# Patient Record
Sex: Male | Born: 2005 | Race: Black or African American | Hispanic: No | Marital: Single | Smoking: Never smoker
Health system: Southern US, Community
[De-identification: ages and names within clinical notes are randomized; demographics above are authoritative.]

## PROBLEM LIST (undated history)

## (undated) ENCOUNTER — Ambulatory Visit (HOSPITAL_COMMUNITY): Admission: EM | Source: Home / Self Care

---
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
## 2024-01-07 ENCOUNTER — Ambulatory Visit

## 2024-02-17 ENCOUNTER — Ambulatory Visit (HOSPITAL_COMMUNITY)
Admission: EM | Admit: 2024-02-17 | Discharge: 2024-02-18 | Disposition: A | Discharge status: Other Acute Inpatient Hospital | Source: Intra-hospital | Attending: Psychiatry | Admitting: Psychiatry

## 2024-02-17 ENCOUNTER — Ambulatory Visit: Payer: Self-pay

## 2024-02-17 VITALS — BP 122/80 | HR 66 | Ht 71.0 in | Wt 206.2 lb

## 2024-02-17 DIAGNOSIS — F4310 Post-traumatic stress disorder, unspecified: Secondary | ICD-10-CM | POA: Diagnosis not present

## 2024-02-17 DIAGNOSIS — F649 Gender identity disorder, unspecified: Secondary | ICD-10-CM

## 2024-02-17 DIAGNOSIS — F640 Transsexualism: Secondary | ICD-10-CM | POA: Insufficient documentation

## 2024-02-17 DIAGNOSIS — R45851 Suicidal ideations: Secondary | ICD-10-CM | POA: Insufficient documentation

## 2024-02-17 DIAGNOSIS — F332 Major depressive disorder, recurrent severe without psychotic features: Secondary | ICD-10-CM | POA: Diagnosis not present

## 2024-02-17 DIAGNOSIS — G4700 Insomnia, unspecified: Secondary | ICD-10-CM | POA: Insufficient documentation

## 2024-02-17 DIAGNOSIS — F32A Depression, unspecified: Secondary | ICD-10-CM | POA: Insufficient documentation

## 2024-02-17 LAB — CBC WITH DIFFERENTIAL/PLATELET
Abs Immature Granulocytes: 0.02 K/uL (ref 0.00–0.07)
Basophils Absolute: 0 K/uL (ref 0.0–0.1)
Basophils Relative: 0 %
Eosinophils Absolute: 0.2 K/uL (ref 0.0–0.5)
Eosinophils Relative: 3 %
HCT: 45 % (ref 39.0–52.0)
Hemoglobin: 13.5 g/dL (ref 13.0–17.0)
Immature Granulocytes: 0 %
Lymphocytes Relative: 34 %
Lymphs Abs: 1.9 K/uL (ref 0.7–4.0)
MCH: 22.5 pg — ABNORMAL LOW (ref 26.0–34.0)
MCHC: 30 g/dL (ref 30.0–36.0)
MCV: 74.9 fL — ABNORMAL LOW (ref 80.0–100.0)
Monocytes Absolute: 0.6 K/uL (ref 0.1–1.0)
Monocytes Relative: 11 %
Neutro Abs: 2.8 K/uL (ref 1.7–7.7)
Neutrophils Relative %: 52 %
Platelets: 204 K/uL (ref 150–400)
RBC: 6.01 MIL/uL — ABNORMAL HIGH (ref 4.22–5.81)
RDW: 15.1 % (ref 11.5–15.5)
WBC: 5.5 K/uL (ref 4.0–10.5)
nRBC: 0 % (ref 0.0–0.2)

## 2024-02-17 LAB — COMPREHENSIVE METABOLIC PANEL WITH GFR
ALT: 70 U/L — ABNORMAL HIGH (ref 0–44)
AST: 41 U/L (ref 15–41)
Albumin: 4.6 g/dL (ref 3.5–5.0)
Alkaline Phosphatase: 98 U/L (ref 38–126)
Anion gap: 11 (ref 5–15)
BUN: 16 mg/dL (ref 6–20)
CO2: 29 mmol/L (ref 22–32)
Calcium: 10 mg/dL (ref 8.9–10.3)
Chloride: 101 mmol/L (ref 98–111)
Creatinine, Ser: 0.92 mg/dL (ref 0.61–1.24)
GFR, Estimated: >60 mL/min (ref >60)
Glucose, Bld: 81 mg/dL (ref 70–99)
Potassium: 5.1 mmol/L (ref 3.5–5.1)
Sodium: 141 mmol/L (ref 135–145)
Total Bilirubin: 0.3 mg/dL (ref 0.0–1.2)
Total Protein: 7.4 g/dL (ref 6.5–8.1)

## 2024-02-17 LAB — POCT URINE DRUG SCREEN - MANUAL ENTRY (I-SCREEN)
POC Amphetamine UR: NOT DETECTED
POC Buprenorphine (BUP): NOT DETECTED
POC Cocaine UR: NOT DETECTED
POC Marijuana UR: NOT DETECTED
POC Methadone UR: NOT DETECTED
POC Methamphetamine UR: NOT DETECTED
POC Morphine: NOT DETECTED
POC Oxazepam (BZO): NOT DETECTED
POC Oxycodone UR: NOT DETECTED
POC Secobarbital (BAR): NOT DETECTED

## 2024-02-17 LAB — LIPID PANEL
Cholesterol: 175 mg/dL (ref 0–200)
HDL: 52 mg/dL (ref >40)
LDL Cholesterol: 93 mg/dL (ref 0–99)
Total CHOL/HDL Ratio: 3.4 ratio
Triglycerides: 151 mg/dL — ABNORMAL HIGH (ref <150)
VLDL: 30 mg/dL (ref 0–40)

## 2024-02-17 LAB — TSH: TSH: 0.916 u[IU]/mL (ref 0.350–4.500)

## 2024-02-17 MED ORDER — LORAZEPAM 2 MG/ML IJ SOLN: 2.0000 mg | Freq: Three times a day (TID) | INTRAMUSCULAR | Status: DC | PRN

## 2024-02-17 MED ORDER — HALOPERIDOL 5 MG PO TABS: 5.0000 mg | ORAL_TABLET | Freq: Three times a day (TID) | ORAL | Status: DC | PRN

## 2024-02-17 MED ORDER — ACETAMINOPHEN 325 MG PO TABS: 650.0000 mg | ORAL_TABLET | Freq: Four times a day (QID) | ORAL | Status: DC | PRN

## 2024-02-17 MED ORDER — DIPHENHYDRAMINE HCL 50 MG PO CAPS: 50.0000 mg | ORAL_CAPSULE | Freq: Three times a day (TID) | ORAL | Status: DC | PRN

## 2024-02-17 MED ORDER — DIPHENHYDRAMINE HCL 50 MG/ML IJ SOLN: 50.0000 mg | Freq: Three times a day (TID) | INTRAMUSCULAR | Status: DC | PRN

## 2024-02-17 MED ORDER — HALOPERIDOL LACTATE 5 MG/ML IJ SOLN: 10.0000 mg | Freq: Three times a day (TID) | INTRAMUSCULAR | Status: DC | PRN

## 2024-02-17 MED ORDER — HALOPERIDOL LACTATE 5 MG/ML IJ SOLN: 5.0000 mg | Freq: Three times a day (TID) | INTRAMUSCULAR | Status: DC | PRN

## 2024-02-17 MED ORDER — HYDROXYZINE HCL 25 MG PO TABS
25.0000 mg | ORAL_TABLET | ORAL | Status: AC
  Administered 2024-02-17: 23:00:00 25 mg via ORAL
  Filled 2024-02-17: qty 1

## 2024-02-17 NOTE — Assessment & Plan Note (Addendum)
 Concern for active suicidality in patient.  She was previously hospitalized at [facility] in September for suicide attempt.  Discussed present concern for patient's safety to oneself.  Patient amenable to transferring to [facility] for further assessment and possible treatment.  Arranged safe transportation for patient there.  Escorted patient to car side.  Informed [facility] nursing regarding incoming patient.

## 2024-02-17 NOTE — Progress Notes (Cosign Needed Addendum)
° ° °  SUBJECTIVE:   CHIEF COMPLAINT / HPI:   Mood Patient presenting today to discuss overall worsening mood recently.  She describes that over the past few weeks her mood is worse and where she is having more suicidal thoughts.  She has thought to herself that she may use medicines to overdose, though she has no active plan to do so.  Her mother's boyfriend has not been supportive of her transition.  It has been challenging when those around her misgender her or used incorrect pronouns.  She is still following with her therapist.  PERTINENT  PMH / PSH: Gender dysphoria, MDD, PTSD  OBJECTIVE:   BP 122/80   Pulse 66   Ht 5' 11 (1.803 m)   Wt 206 lb 3.2 oz (93.5 kg)   SpO2 96%   BMI 28.76 kg/m    General: No acute distress.  Pulm: Breathing comfortably on room air.  Psych: Normal speech. Intermittent tense posture and fidgeting.   Flowsheet Row Office Visit from 02/17/2024 in [facility] [place]. [facility]  PHQ-9 Total Score 13    ASSESSMENT/PLAN:   Assessment & Plan MDD (major depressive disorder), recurrent severe, without psychosis (HCC) Concern for active suicidality in patient.  She was previously hospitalized at [facility] in September for suicide attempt.  Discussed present concern for patient's safety to oneself.  Patient amenable to transferring to [facility] for further assessment and possible treatment.  Arranged safe transportation for patient there.  Escorted patient to car side.  Informed [facility] nursing regarding incoming patient. Gender dysphoria Provided patient with contact information for endocrinology referral that was previously placed and improved.  Patient interested in freezing sperm prior to initiating gender care.  Discussed details of gender care avenues.    Damien Cassis, MD [facility]

## 2024-02-17 NOTE — Discharge Instructions (Addendum)
 To [facility]
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
## 2024-02-17 NOTE — Assessment & Plan Note (Addendum)
 Provided patient with contact information for endocrinology referral that was previously placed and improved.  Patient interested in freezing sperm prior to initiating gender care.  Discussed details of gender care avenues.

## 2024-02-17 NOTE — Patient Instructions (Addendum)
 [facility] [address] P [phone]

## 2024-02-17 NOTE — Progress Notes (Signed)
 Pt was accepted to [facility] on  02/17/2024  Bed assignment:316   Pt meets inpatient criteria per Lynwood Lavone Bash, MD   Attending Physician will be: Dr.Jadepalle    Report can be called to: [phone]  Pt can arrive pending labs   Care Team Notified: [facility] Cherylynn Ernst, RN,  [place] JAYSON Albee, RN, Lynwood Lavone Bash, MD
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
## 2024-02-17 NOTE — Progress Notes (Signed)
°   02/17/24 1307  [facility] Triage Screening (Walk-ins at [facility] only)  How Did You Hear About Us ? Primary Care  What Is the Reason for Your Visit/Call Today? Wayne Henry is a 18Y transgender male presenting to [facility] vol via safe transport. Pt was brought here today after they had a visit with their doctor at [facility]. Pt states they have constant suicidal thoughts with no plan. Pt states there is no trigger and the thoughts never go away. Pt endorses current SI. Pt denies HI, AVH, anf substance use. Pt has a hx of depression, anxiety, and ptsd.  How Long Has This Been Causing You Problems? 1 wk - 1 month  Have You Recently Had Any Thoughts About Hurting Yourself? Yes  How long ago did you have thoughts about hurting yourself? Now  Are You Planning to Commit Suicide/Harm Yourself At This time? Yes  Have you Recently Had Thoughts About Hurting Someone Sherral? No  Are You Planning To Harm Someone At This Time? No  Are you currently experiencing any auditory, visual or other hallucinations? No  Have You Used Any Alcohol or Drugs in the Past 24 Hours? No  Do you have any current medical co-morbidities that require immediate attention? No  What Do You Feel Would Help You the Most Today? Treatment for Depression or other mood problem  Determination of Need Urgent (48 hours)  Options For Referral Inpatient Hospitalization;Outpatient Therapy  Determination of Need filed? Yes

## 2024-02-18 ENCOUNTER — Inpatient Hospital Stay
Admission: AD | Admit: 2024-02-18 | Discharge: 2024-02-22 | DRG: 882 | Disposition: A | Discharge status: Home / Self Care | Source: Intra-hospital | Attending: Psychiatry | Admitting: Psychiatry

## 2024-02-18 ENCOUNTER — Other Ambulatory Visit: Payer: Self-pay

## 2024-02-18 DIAGNOSIS — F4310 Post-traumatic stress disorder, unspecified: Principal | ICD-10-CM | POA: Diagnosis present

## 2024-02-18 DIAGNOSIS — F32A Depression, unspecified: Secondary | ICD-10-CM | POA: Diagnosis present

## 2024-02-18 DIAGNOSIS — Z9151 Personal history of suicidal behavior: Secondary | ICD-10-CM

## 2024-02-18 DIAGNOSIS — Z9152 Personal history of nonsuicidal self-harm: Secondary | ICD-10-CM | POA: Diagnosis not present

## 2024-02-18 DIAGNOSIS — F640 Transsexualism: Secondary | ICD-10-CM | POA: Diagnosis present

## 2024-02-18 DIAGNOSIS — F419 Anxiety disorder, unspecified: Secondary | ICD-10-CM | POA: Diagnosis present

## 2024-02-18 DIAGNOSIS — Z8249 Family history of ischemic heart disease and other diseases of the circulatory system: Secondary | ICD-10-CM | POA: Diagnosis not present

## 2024-02-18 DIAGNOSIS — Z79899 Other long term (current) drug therapy: Secondary | ICD-10-CM

## 2024-02-18 DIAGNOSIS — R45851 Suicidal ideations: Secondary | ICD-10-CM | POA: Diagnosis present

## 2024-02-18 DIAGNOSIS — Z833 Family history of diabetes mellitus: Secondary | ICD-10-CM

## 2024-02-18 LAB — HEMOGLOBIN A1C
Hgb A1c MFr Bld: 5.2 % (ref 4.8–5.6)
Mean Plasma Glucose: 102.54 mg/dL

## 2024-02-18 MED ORDER — LORAZEPAM 2 MG/ML IJ SOLN: 2.0000 mg | Freq: Three times a day (TID) | INTRAMUSCULAR | Status: DC | PRN

## 2024-02-18 MED ORDER — DIPHENHYDRAMINE HCL 50 MG/ML IJ SOLN: 50.0000 mg | Freq: Three times a day (TID) | INTRAMUSCULAR | Status: DC | PRN

## 2024-02-18 MED ORDER — HALOPERIDOL 5 MG PO TABS: 5.0000 mg | ORAL_TABLET | Freq: Three times a day (TID) | ORAL | Status: DC | PRN

## 2024-02-18 MED ORDER — HYDROXYZINE HCL 25 MG PO TABS
25.0000 mg | ORAL_TABLET | Freq: Three times a day (TID) | ORAL | Status: DC | PRN
  Administered 2024-02-18 – 2024-02-22 (×6): 25 mg via ORAL
  Filled 2024-02-18 (×6): qty 1

## 2024-02-18 MED ORDER — HALOPERIDOL LACTATE 5 MG/ML IJ SOLN: 10.0000 mg | Freq: Three times a day (TID) | INTRAMUSCULAR | Status: DC | PRN

## 2024-02-18 MED ORDER — DIPHENHYDRAMINE HCL 25 MG PO CAPS: 50.0000 mg | ORAL_CAPSULE | Freq: Three times a day (TID) | ORAL | Status: DC | PRN

## 2024-02-18 MED ORDER — MAGNESIUM HYDROXIDE 400 MG/5ML PO SUSP: 30.0000 mL | Freq: Every day | ORAL | Status: DC | PRN

## 2024-02-18 MED ORDER — SERTRALINE HCL 25 MG PO TABS
50.0000 mg | ORAL_TABLET | Freq: Every day | ORAL | Status: DC
  Administered 2024-02-18: 08:00:00 50 mg via ORAL
  Filled 2024-02-18: qty 2

## 2024-02-18 MED ORDER — ACETAMINOPHEN 325 MG PO TABS
650.0000 mg | ORAL_TABLET | Freq: Four times a day (QID) | ORAL | Status: DC | PRN
  Administered 2024-02-19: 650 mg via ORAL
  Filled 2024-02-18: qty 2

## 2024-02-18 MED ORDER — VITAMIN D (ERGOCALCIFEROL) 1.25 MG (50000 UNIT) PO CAPS
50000.0000 [IU] | ORAL_CAPSULE | ORAL | Status: DC
  Administered 2024-02-18: 08:00:00 50000 [IU] via ORAL
  Filled 2024-02-18: qty 1

## 2024-02-18 MED ORDER — SERTRALINE HCL 100 MG PO TABS
100.0000 mg | ORAL_TABLET | Freq: Every day | ORAL | Status: DC
  Administered 2024-02-19 – 2024-02-22 (×4): 100 mg via ORAL
  Filled 2024-02-18 (×4): qty 1

## 2024-02-18 MED ORDER — ALUM & MAG HYDROXIDE-SIMETH 200-200-20 MG/5ML PO SUSP: 30.0000 mL | ORAL | Status: DC | PRN

## 2024-02-18 MED ORDER — HALOPERIDOL LACTATE 5 MG/ML IJ SOLN: 5.0000 mg | Freq: Three times a day (TID) | INTRAMUSCULAR | Status: DC | PRN

## 2024-02-18 NOTE — Group Note (Signed)
 Date:  02/18/2024 Time:  10:09 AM  Group Topic/Focus:  Emotional Education:   The focus of this group is to discuss what feelings/emotions are, and how they are experienced.    Participation Level:  Did Not Attend   Wayne Henry 02/18/2024, 10:09 AM
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
## 2024-02-18 NOTE — Group Note (Signed)
 [facility] LCSW Group Therapy Note   Group Date: 02/18/2024 Start Time: 1300 End Time: 1400   Type of Therapy/Topic:  Group Therapy:  Balance in Life  Participation Level:  Did Not Attend   Description of Group:    This group will address the concept of balance and how it feels and looks when one is unbalanced. Patients will be encouraged to process areas in their lives that are out of balance, and identify reasons for remaining unbalanced. Facilitators will guide patients utilizing problem- solving interventions to address and correct the stressor making their life unbalanced. Understanding and applying boundaries will be explored and addressed for obtaining  and maintaining a balanced life. Patients will be encouraged to explore ways to assertively make their unbalanced needs known to significant others in their lives, using other group members and facilitator for support and feedback.  Therapeutic Goals: Patient will identify two or more emotions or situations they have that consume much of in their lives. Patient will identify signs/triggers that life has become out of balance:  Patient will identify two ways to set boundaries in order to achieve balance in their lives:  Patient will demonstrate ability to communicate their needs through discussion and/or role plays  Summary of Patient Progress: Patient did not attend group.   Therapeutic Modalities:   Cognitive Behavioral Therapy Solution-Focused Therapy Assertiveness Training   Nadara JONELLE Fam, LCSW

## 2024-02-18 NOTE — Plan of Care (Signed)

## 2024-02-18 NOTE — Group Note (Signed)
 Date:  02/18/2024 Time:  5:09 PM  Group Topic/Focus:  Activity Group: The focus of the group is to promote activity for the patients and encourage them to go outside to the courtyard and get some fresh air and exercise.    Participation Level:  Did Not Attend   Wayne Henry 02/18/2024, 5:09 PM

## 2024-02-18 NOTE — Group Note (Signed)
 Recreation Therapy Group Note   Group Topic:Goal Setting  Group Date: 02/18/2024 Start Time: 1000 End Time: 1050 Facilitators: Celestia Jeoffrey BRAVO, LRT, CTRS Location: Craft Room  Group Description: Product/process Development Scientist. Patients were given many different magazines, a glue stick, markers, and a piece of cardstock paper. LRT and pts discussed the importance of having goals in life. LRT and pts discussed the difference between short-term and long-term goals, as well as what a SMART goal is. LRT encouraged pts to create a vision board, with images they picked and then cut out with safety scissors from the magazine, for themselves, that capture their short and long-term goals. LRT encouraged pts to show and explain their vision board to the group.   Goal Area(s) Addressed:  Patient will gain knowledge of short vs. long term goals.  Patient will identify goals for themselves. Patient will practice setting SMART goals. Patient will verbalize their goals to LRT and peers.   Affect/Mood: N/A   Participation Level: Did not attend    Clinical Observations/Individualized Feedback: Patient did not attend.  Plan: Continue to engage patient in RT group sessions 2-3x/week.   Jeoffrey BRAVO Celestia, LRT, CTRS 02/18/2024 11:28 AM

## 2024-02-18 NOTE — Group Note (Signed)
 Date:  02/18/2024 Time:  9:21 PM  Group Topic/Focus:  Managing Feelings:   The focus of this group is to identify what feelings patients have difficulty handling and develop a plan to handle them in a healthier way upon discharge.    Pt did not attend group.  Eldean Nanna L 02/18/2024, 9:21 PM

## 2024-02-18 NOTE — Plan of Care (Signed)
 Pt new to the unit tonight, hasn't had time to progress  Problem: Education: Goal: Knowledge of [place] [facility] Education information/materials will improve Outcome: Not Progressing Goal: Emotional status will improve Outcome: Not Progressing Goal: Mental status will improve Outcome: Not Progressing Goal: Verbalization of understanding the information provided will improve Outcome: Not Progressing   Problem: Activity: Goal: Interest or engagement in activities will improve Outcome: Not Progressing Goal: Sleeping patterns will improve Outcome: Not Progressing   Problem: Coping: Goal: Ability to verbalize frustrations and anger appropriately will improve Outcome: Not Progressing Goal: Ability to demonstrate self-control will improve Outcome: Not Progressing   Problem: Health Behavior/Discharge Planning: Goal: Identification of resources available to assist in meeting health care needs will improve Outcome: Not Progressing Goal: Compliance with treatment plan for underlying cause of condition will improve Outcome: Not Progressing   Problem: Physical Regulation: Goal: Ability to maintain clinical measurements within normal limits will improve Outcome: Not Progressing   Problem: Safety: Goal: Periods of time without injury will increase Outcome: Not Progressing

## 2024-02-18 NOTE — Progress Notes (Signed)
 Pt calm and cooperative during shift. Pt participated in group and ate meals, new dinner tray provided with pt request. Pt denied SI/HI/AVH. Pt currently in highschool and inquired about moving to a different unit. AP reports that the pt is 18 and on the correct unit. Pt asked if she wanted her parents to bring homework from the school to complete, pt denied this request.      02/18/24 0753  Psych Admission Type (Psych Patients Only)  Admission Status Voluntary  Psychosocial Assessment  Patient Complaints None  Eye Contact Brief  Facial Expression Flat  Affect Sad  Speech Logical/coherent  Interaction Cautious  Motor Activity Slow  Appearance/Hygiene Unremarkable  Behavior Characteristics Cooperative;Calm;Appropriate to situation  Mood Depressed  Thought Process  Coherency WDL  Content WDL  Delusions WDL  Perception WDL  Hallucination None reported or observed  Judgment Impaired  Confusion WDL  Danger to Self  Current suicidal ideation? Denies  Agreement Not to Harm Self Yes  Description of Agreement verbal  Danger to Others  Danger to Others None reported or observed
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
## 2024-02-18 NOTE — Group Note (Signed)
 Recreation Therapy Group Note   Group Topic:General Recreation  Group Date: 02/18/2024 Start Time: 1525 End Time: 1545 Facilitators: Celestia Jeoffrey BRAVO, LRT, CTRS Location: Courtyard  Group Description: [employer]. LRT and patients played games of basketball, drew with chalk, and played corn hole while outside in the courtyard while getting fresh air and sunlight. Music was being played in the background. LRT and peers conversed about different games they have played before, what they do in their free time and anything else that is on their minds. LRT encouraged pts to drink water after being outside, sweating and getting their heart rate up.  Goal Area(s) Addressed: Patient will build on frustration tolerance skills. Patients will partake in a competitive play game with peers. Patients will gain knowledge of new leisure interest/hobby.    Affect/Mood: N/A   Participation Level: Did not attend    Clinical Observations/Individualized Feedback: Patient did not attend.  Plan: Continue to engage patient in RT group sessions 2-3x/week.   Jeoffrey BRAVO Celestia, LRT, CTRS 02/18/2024 4:16 PM

## 2024-02-19 DIAGNOSIS — F4310 Post-traumatic stress disorder, unspecified: Secondary | ICD-10-CM

## 2024-02-19 MED ORDER — NICOTINE POLACRILEX 2 MG MT GUM
2.0000 mg | CHEWING_GUM | OROMUCOSAL | Status: DC | PRN
  Administered 2024-02-19 – 2024-02-21 (×2): 2 mg via ORAL
  Filled 2024-02-19 (×2): qty 1

## 2024-02-19 MED ORDER — BUSPIRONE HCL 5 MG PO TABS
5.0000 mg | ORAL_TABLET | Freq: Two times a day (BID) | ORAL | Status: DC
  Administered 2024-02-20 – 2024-02-22 (×5): 5 mg via ORAL
  Filled 2024-02-19 (×5): qty 1

## 2024-02-19 MED ORDER — TRAZODONE HCL 50 MG PO TABS
50.0000 mg | ORAL_TABLET | Freq: Every evening | ORAL | Status: DC | PRN
  Administered 2024-02-20 – 2024-02-21 (×2): 50 mg via ORAL
  Filled 2024-02-19 (×2): qty 1

## 2024-02-19 MED ORDER — NICOTINE 14 MG/24HR TD PT24
14.0000 mg | MEDICATED_PATCH | Freq: Every day | TRANSDERMAL | Status: DC
  Administered 2024-02-19 – 2024-02-22 (×3): 14 mg via TRANSDERMAL
  Filled 2024-02-19 (×4): qty 1

## 2024-02-19 NOTE — Progress Notes (Signed)
 Pt calm and pleasant during assessment denying SI/HI/AVH. Pt observed by this control and instrumentation engineer appropriately with staff and peers on the unit by this clinical research associate. Pt compliant with medication administration per MD orders. Pt given education, support, and encouragement to be active in his treatment plan. Pt being monitored Q 15 minutes for safety per unit protocol, remains safe on the unit
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
## 2024-02-19 NOTE — Group Note (Signed)
 Recreation Therapy Group Note   Group Topic:Leisure Education  Group Date: 02/19/2024 Start Time: 1530 End Time: 1620 Facilitators: Celestia Jeoffrey FORBES ARTICE, CTRS Location: Craft Room  Group Description: Leisure. Patients were given the option to choose from journaling, coloring, drawing, making origami, playing with playdoh, listening to music or singing karaoke. LRT and pts discussed the meaning of leisure, the importance of participating in leisure during their free time/when they're outside of the hospital, as well as how our leisure interests can also serve as coping skills.   Goal Area(s) Addressed:  Patient will identify a current leisure interest.  Patient will learn the definition of leisure. Patient will practice making a positive decision. Patient will have the opportunity to try a new leisure activity. Patient will communicate with peers and LRT.   Affect/Mood: Appropriate   Participation Level: Active and Engaged   Participation Quality: Independent   Behavior: Calm and Cooperative   Speech/Thought Process: Coherent   Insight: Good   Judgement: Good   Modes of Intervention: Clarification, Education, Exploration, and Music   Patient Response to Interventions:  Attentive, Engaged, Interested , and Receptive   Education Outcome:  Acknowledges education   Clinical Observations/Individualized Feedback: Wayne Henry was active in their participation of session activities and group discussion. Pt identified draw and read as things she does in her free time.    Plan: Continue to engage patient in RT group sessions 2-3x/week.   Jeoffrey FORBES Celestia, LRT, CTRS 02/19/2024 4:59 PM

## 2024-02-19 NOTE — BHH Counselor (Signed)
 CSW attempted contact with [facility] Coordinator, Nat Rich [phone]). Contact was unable to be established but HIPAA compliant voicemail left with contact information for follow through.   Nadara SAUNDERS. Chaim, MSW, LCSW, LCAS 02/19/2024 4:27 PM

## 2024-02-19 NOTE — Group Note (Signed)
 Date:  02/19/2024 Time:  10:35 AM  Group Topic/Focus:  Goals Group:   The focus of this group is to help patients establish daily goals to achieve during treatment and discuss how the patient can incorporate goal setting into their daily lives to aide in recovery.    Participation Level:  Active  Participation Quality:  Appropriate  Affect:  Appropriate  Cognitive:  Appropriate  Insight: Appropriate  Engagement in Group:  Engaged  Modes of Intervention:  Discussion, Education, and Support  Additional Comments:    Deitra Caron Mainland 02/19/2024, 10:35 AM

## 2024-02-19 NOTE — Group Note (Signed)
 Date:  02/19/2024 Time:  8:54 PM  Group Topic/Focus:  Coping With Mental Health Crisis:   The purpose of this group is to help patients identify strategies for coping with mental health crisis.  Group discusses possible causes of crisis and ways to manage them effectively. Developing a Wellness Toolbox:   The focus of this group is to help patients develop a wellness toolbox with skills and strategies to promote recovery upon discharge.    Participation Level:  Active  Participation Quality:  Appropriate  Affect:  Appropriate  Cognitive:  Appropriate  Insight: Appropriate  Engagement in Group:  Engaged  Modes of Intervention:  Discussion  Additional Comments:    Philisha Weinel L 02/19/2024, 8:54 PM

## 2024-02-19 NOTE — Group Note (Signed)
 Date:  02/19/2024 Time:  7:12 PM  Group Topic/Focus:  Wellness Toolbox:   The focus of this group is to discuss various aspects of wellness, balancing those aspects and exploring ways to increase the ability to experience wellness.  Patients will create a wellness toolbox for use upon discharge.    Participation Level:  Active  Participation Quality:  Appropriate  Affect:  Appropriate  Cognitive:  Appropriate  Insight: Appropriate  Engagement in Group:  Engaged  Modes of Intervention:  Activity and Socialization  Additional Comments:    Wayne Henry 02/19/2024, 7:12 PM
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
## 2024-02-19 NOTE — Plan of Care (Signed)
   Problem: Education: Goal: Emotional status will improve Outcome: Progressing Goal: Mental status will improve Outcome: Progressing

## 2024-02-19 NOTE — Progress Notes (Signed)
" °   02/18/24 2046  Charting Type  Charting Type Shift assessment  Safety Check Verification  Has the RN verified the 15 minute safety check completion? Yes  Neurological  Neuro (WDL) WDL  HEENT  HEENT (WDL) WDL  Respiratory  Respiratory (WDL) WDL  Cardiac  Cardiac (WDL) WDL  Vascular  Vascular (WDL) WDL  Integumentary  Integumentary (WDL) X  Braden Scale (Ages 8 and up)  Sensory Perceptions 4  Moisture 4  Activity 4  Mobility 4  Nutrition 4  Friction and Shear 3  Braden Scale Score 23  Musculoskeletal  Musculoskeletal (WDL) WDL  Assistive Device None  Gastrointestinal  Gastrointestinal (WDL) WDL  GU Assessment  Genitourinary (WDL) WDL  Neurological  Level of Consciousness Alert    "

## 2024-02-19 NOTE — Plan of Care (Signed)
  Problem: Education: Goal: Emotional status will improve Outcome: Progressing   Problem: Education: Goal: Mental status will improve Outcome: Progressing   Problem: Education: Goal: Verbalization of understanding the information provided will improve Outcome: Progressing   Problem: Activity: Goal: Interest or engagement in activities will improve Outcome: Progressing   Problem: Activity: Goal: Sleeping patterns will improve Outcome: Progressing   

## 2024-02-20 DIAGNOSIS — F4310 Post-traumatic stress disorder, unspecified: Secondary | ICD-10-CM | POA: Diagnosis not present

## 2024-02-20 NOTE — Progress Notes (Signed)
" °   02/19/24 2000  Psych Admission Type (Psych Patients Only)  Admission Status Voluntary  Psychosocial Assessment  Patient Complaints None  Eye Contact Brief  Facial Expression Flat  Affect Anxious  Speech Logical/coherent;Soft  Interaction Assertive;Cautious  Motor Activity Slow  Appearance/Hygiene Improved  Behavior Characteristics Cooperative;Appropriate to situation  Mood Pleasant  Thought Process  Coherency WDL  Content WDL  Delusions WDL  Perception WDL  Hallucination None reported or observed  Judgment Impaired  Confusion WDL  Danger to Self  Current suicidal ideation? Denies  Agreement Not to Harm Self Yes  Description of Agreement verbal  Danger to Others  Danger to Others None reported or observed   Patient alert and oriented x 4, affect is flat but brightens upon approach, no distress noted, denies S/HI/AVH  15 minutes safety checks maintained will continue to monitor.  "

## 2024-02-20 NOTE — Group Note (Deleted)
 Date:  02/20/2024 Time:  8:44 PM  Group Topic/Focus:  Self Esteem Action Plan:   The focus of this group is to help patients create a plan to continue to build self-esteem after discharge.     Participation Level:  {BHH PARTICIPATION OZCZO:77735}  Participation Quality:  {BHH PARTICIPATION QUALITY:22265}  Affect:  {BHH AFFECT:22266}  Cognitive:  {BHH COGNITIVE:22267}  Insight: {BHH Insight2:20797}  Engagement in Group:  {BHH ENGAGEMENT IN HMNLE:77731}  Modes of Intervention:  {BHH MODES OF INTERVENTION:22269}  Additional Comments:  ***  Luciano Emilio CROME 02/20/2024, 8:44 PM

## 2024-02-20 NOTE — Plan of Care (Signed)
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
## 2024-02-20 NOTE — BHH Suicide Risk Assessment (Signed)
 BHH INPATIENT:  Family/Significant Other Suicide Prevention Education  Suicide Prevention Education:  Education Completed; Wayne Henry, mother, [phone]   (name of family member/significant other) has been identified by the patient as the family member/significant other with whom the patient will be residing, and identified as the person(s) who will aid the patient in the event of a mental health crisis (suicidal ideations/suicide attempt).  With written consent from the patient, the family member/significant other has been provided the following suicide prevention education, prior to the and/or following the discharge of the patient.  The suicide prevention education provided includes the following: Suicide risk factors Suicide prevention and interventions National Suicide Hotline telephone number [facility] assessment telephone number [facility] Emergency Assistance 911 [facility] and/or Residential Mobile Crisis Unit telephone number  Request made of family/significant other to: Remove weapons (e.g., guns, rifles, knives), all items previously/currently identified as safety concern.   Remove drugs/medications (over-the-counter, prescriptions, illicit drugs), all items previously/currently identified as a safety concern.  The family member/significant other verbalizes understanding of the suicide prevention education information provided.  The family member/significant other agrees to remove the items of safety concern listed above.  Pt's mother reports pt went to the doctor for a regular check up on Thursday, and reports the doctor called her and told her due to what they talked about they suggested that pt come here.   Reports pt was at [facility] last year after pt made had made an attempt to commit suicide, reports pt hasn't made an attempt since then.   Reports that she tries to talk to Moonie, reports she feels that pt is scared to go into adulthood and feels  that this is the reason for the depression  Reports that she has put away all weapons , reports guns are in a gun safe.   Reports that pt is seeing a outpatient therapist and psychiatrist. Reports pt can return to her home at discharge.   Wayne Henry 02/20/2024, 11:36 AM

## 2024-02-20 NOTE — Group Note (Signed)
 Date:  02/20/2024 Time:  10:06 AM  Group Topic/Focus:  Managing Feelings:   The focus of this group is to identify what feelings patients have difficulty handling and develop a plan to handle them in a healthier way upon discharge.    Participation Level:  Active  Participation Quality:  Appropriate  Affect:  Appropriate  Cognitive:  Appropriate  Insight: Appropriate  Engagement in Group:  Engaged  Modes of Intervention:  Activity  Additional Comments:    Wayne Henry 02/20/2024, 10:06 AM

## 2024-02-20 NOTE — BHH Counselor (Signed)
 CSW was informed by NP that pt reports she would like to stay with her brother at discharge but report brother does not currently have a phone.   CSW contacted pt's mother, pt's mother states that she will contact pt's brother Trayvon [phone]  to confirm as Trayvon's phone only works when he is on wifi and he also works third shift.   CSW attempted to contact Trayvon twice, unable to leave VM requesting return call.   CSW asked pt's mother to call CSW back if she gets in touch with Trayvon as pt is expected to discharge Monday.    Lum Croft, MSW, [place] 02/20/2024 1:58 PM

## 2024-02-20 NOTE — Progress Notes (Signed)
" °   02/20/24 0904  Psych Admission Type (Psych Patients Only)  Admission Status Voluntary  Psychosocial Assessment  Patient Complaints None  Eye Contact Fair  Facial Expression Animated  Affect Appropriate to circumstance  Speech Logical/coherent  Interaction Assertive  Motor Activity Slow  Appearance/Hygiene Unremarkable  Behavior Characteristics Cooperative;Appropriate to situation  Mood Pleasant  Aggressive Behavior  Effect No apparent injury  Thought Process  Coherency WDL  Content WDL  Delusions None reported or observed  Perception WDL  Hallucination None reported or observed  Judgment Impaired  Confusion WDL  Danger to Self  Current suicidal ideation? Denies  Agreement Not to Harm Self Yes  Description of Agreement verbal  Danger to Others  Danger to Others None reported or observed    "

## 2024-02-20 NOTE — Group Note (Signed)
 Date:  02/20/2024 Time:  8:46 PM  Group Topic/Focus:  Self Esteem Action Plan:   The focus of this group is to help patients create a plan to continue to build self-esteem after discharge.    Participation Level:  Active  Participation Quality:  Appropriate  Affect:  Appropriate  Cognitive:  Appropriate  Insight: Appropriate  Engagement in Group:  Engaged  Modes of Intervention:  Discussion  Additional Comments:    Kieryn Burtis L 02/20/2024, 8:46 PM

## 2024-02-20 NOTE — Group Note (Signed)
"                                                 BHH LCSW Group Therapy Note    Group Date: 02/20/2024 Start Time: 1520 End Time: 1600  Type of Therapy and Topic:  Group Therapy:  Overcoming Obstacles  Participation Level:  BHH PARTICIPATION LEVEL: Active  Mood: pleasant, calm  Description of Group:   In this group patients will be encouraged to explore what they see as obstacles to their own wellness and recovery. They will be guided to discuss their thoughts, feelings, and behaviors related to these obstacles. The group will process together ways to cope with barriers, with attention given to specific choices patients can make. Each patient will be challenged to identify changes they are motivated to make in order to overcome their obstacles. This group will be process-oriented, with patients participating in exploration of their own experiences as well as giving and receiving support and challenge from other group members.  Therapeutic Goals: 1. Patient will identify personal and current obstacles as they relate to admission. 2. Patient will identify barriers that currently interfere with their wellness or overcoming obstacles.  3. Patient will identify feelings, thought process and behaviors related to these barriers. 4. Patient will identify two changes they are willing to make to overcome these obstacles:    Summary of Patient Progress The facilitator and patient discussed things they could control and not control.  Group members create an individual statement on what they could not control.  The facilitator and patient examine how different experiences can influence their mood.  The patient reflected on how their behaviors shape their current control and things that they can not control.  This distinction allows the patient to honor their limitations and channel their efforts where they will make a difference.     Therapeutic Modalities:   Cognitive Behavioral Therapy Solution  Focused Therapy Motivational Interviewing Relapse Prevention Therapy   Rexene LELON Mae, LCSWA "

## 2024-02-21 DIAGNOSIS — F4310 Post-traumatic stress disorder, unspecified: Secondary | ICD-10-CM | POA: Diagnosis not present

## 2024-02-21 NOTE — Progress Notes (Signed)
" °   02/20/24 2000  Psych Admission Type (Psych Patients Only)  Admission Status Voluntary  Psychosocial Assessment  Patient Complaints None  Eye Contact Brief  Facial Expression Flat  Affect Anxious  Speech Logical/coherent;Soft  Interaction Assertive;Cautious  Motor Activity Slow  Appearance/Hygiene Improved  Behavior Characteristics Cooperative;Appropriate to situation  Mood Pleasant  Thought Process  Coherency WDL  Content WDL  Delusions WDL  Perception WDL  Hallucination None reported or observed  Judgment Impaired  Confusion WDL  Danger to Self  Current suicidal ideation? Denies  Agreement Not to Harm Self Yes  Description of Agreement verbal  Danger to Others  Danger to Others None reported or observed   No distress noted, affect is flat but brightens upon approach, he denies SI/HI/AVH interacting appropriately with peers and staff. 15 minutes safety checks maintained will continue to monitor.  "

## 2024-02-21 NOTE — Progress Notes (Signed)
" °   02/21/24 0933  Psych Admission Type (Psych Patients Only)  Admission Status Voluntary  Psychosocial Assessment  Patient Complaints None  Eye Contact Fair  Facial Expression Animated  Affect Appropriate to circumstance  Speech Logical/coherent  Interaction Assertive  Motor Activity Slow  Appearance/Hygiene Unremarkable  Behavior Characteristics Cooperative;Appropriate to situation  Mood Pleasant  Aggressive Behavior  Effect No apparent injury  Thought Process  Coherency WDL  Content WDL  Delusions None reported or observed  Perception WDL  Hallucination None reported or observed  Judgment Impaired  Confusion WDL  Danger to Self  Current suicidal ideation? Denies  Agreement Not to Harm Self Yes  Description of Agreement verbal  Danger to Others  Danger to Others None reported or observed    "
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
## 2024-02-21 NOTE — Group Note (Signed)
 Date:  02/21/2024 Time:  10:36 AM  Group Topic/Focus:  Building Self Esteem:   The Focus of this group is helping patients become aware of the effects of self-esteem on their lives, the things they and others do that enhance or undermine their self-esteem, seeing the relationship between their level of self-esteem and the choices they make and learning ways to enhance self-esteem.    Participation Level:  Active  Participation Quality:  Appropriate  Affect:  Appropriate  Cognitive:  Appropriate  Insight: Appropriate  Engagement in Group:  Engaged  Modes of Intervention:  Activity, Education, and Support  Additional Comments:    Wayne Henry June 02/21/2024, 10:36 AM

## 2024-02-21 NOTE — Plan of Care (Signed)
  Problem: Education: Goal: Emotional status will improve Outcome: Progressing   Problem: Education: Goal: Knowledge of [facility] Education information/materials will improve Outcome: Progressing   Problem: Education: Goal: Mental status will improve Outcome: Progressing

## 2024-02-21 NOTE — Plan of Care (Signed)
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
## 2024-02-22 DIAGNOSIS — F4310 Post-traumatic stress disorder, unspecified: Secondary | ICD-10-CM | POA: Diagnosis not present

## 2024-02-22 MED ORDER — SERTRALINE HCL 100 MG PO TABS: 100.0000 mg | ORAL_TABLET | Freq: Every day | ORAL | 0 refills | Status: AC

## 2024-02-22 MED ORDER — BUSPIRONE HCL 5 MG PO TABS: 5.0000 mg | ORAL_TABLET | Freq: Two times a day (BID) | ORAL | 0 refills | Status: AC

## 2024-02-22 NOTE — BHH Suicide Risk Assessment (Signed)
 [facility] Discharge Suicide Risk Assessment   Principal Problem: PTSD (post-traumatic stress disorder) Discharge Diagnoses: Principal Problem:   PTSD (post-traumatic stress disorder)   Total Time spent with patient: 30 minutes  Musculoskeletal: Strength & Muscle Tone: within normal limits Gait & Station: normal Patient leans: N/A  Psychiatric Specialty Exam  Presentation  General Appearance:  Appropriate for Environment  Eye Contact: Fair  Speech: Clear and Coherent  Speech Volume: Normal  Handedness: Right   Mood and Affect  Mood: Euthymic  Duration of Depression Symptoms: Greater than two weeks  Affect: Appropriate   Thought Process  Thought Processes: Coherent  Descriptions of Associations:Intact  Orientation:Full (Time, Place and Person)  Thought Content:Logical  History of Schizophrenia/Schizoaffective disorder:No  Duration of Psychotic Symptoms:No data recorded Hallucinations:Hallucinations: None  Ideas of Reference:None  Suicidal Thoughts:Suicidal Thoughts: No  Homicidal Thoughts:Homicidal Thoughts: No   Sensorium  Memory: Immediate Fair; Recent Fair  Judgment: Fair  Insight: Fair   Art Therapist  Concentration: Fair  Attention Span: Fair  Recall: [employer] of Knowledge: Fair  Language: Fair   Psychomotor Activity  Psychomotor Activity: Psychomotor Activity: Normal   Assets  Assets: Communication Skills; Physical Health   Sleep  Sleep: Sleep: Fair  Estimated Sleeping Duration (Last 24 Hours): 5.25-6.75 hours  Physical Exam: Physical Exam ROS Blood pressure 112/75, pulse 66, temperature 97.7 F (36.5 C), temperature source Oral, resp. rate 16, height 5' 11 (1.803 m), weight 93.4 kg, SpO2 100%. Body mass index is 28.73 kg/m.  Mental Status Per Nursing Assessment::   On Admission:  Self-harm thoughts  Demographic Factors:  Male  Loss Factors: NA  Historical  Factors: Impulsivity  Risk Reduction Factors:   Living with another person, especially a relative, Positive social support, Positive therapeutic relationship, and Positive coping skills or problem solving skills  Continued Clinical Symptoms:  Depression:   Impulsivity  Cognitive Features That Contribute To Risk:  None    Suicide Risk:  Minimal: No identifiable suicidal ideation.  Patients presenting with no risk factors but with morbid ruminations; may be classified as minimal risk based on the severity of the depressive symptoms   Follow-up Information     [facility] Follow up.   Specialty: Behavioral Health Why: Contact them to schedule follow up appointment. Contact information: [address] [place] [address] [phone]                 Plan Of Care/Follow-up recommendations:  Activity:  As tolerated  Allyn Foil, MD 02/22/2024, 12:05 PM

## 2024-02-22 NOTE — BHH Counselor (Signed)
 CSW spoke with pt briefly regarding discharge plans. Pt reported that they plan to go to brother's upon discharge and that mother will be providing transportation. Pt stated that they would like to continue with current outpatient provider [facility]. They reported that they have clothes to wear upon discharge. Pt denied smoking or any use of substances. No other concerns expressed. Contact ended without incident.   CSW attempted contact with brother Trayvon [phone]). Contact was unable to be established. CSW team was able to confirm that pt can return to brother's home upon discharge.   CSW attempted to contact mother, Ramonita Penny [phone]). CSW was unable to establish contact or leave voicemail.   CSW attempted to contact outpatient provider, [facility] [phone]). Contact was unable to be established but HIPAA compliant voicemail left with contact information for follow through.   Nadara SAUNDERS. Chaim, MSW, LCSW, LCAS 02/22/2024 10:19 AM
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
## 2024-02-22 NOTE — Progress Notes (Signed)
" °   02/22/24 0857  Psych Admission Type (Psych Patients Only)  Admission Status Voluntary  Psychosocial Assessment  Patient Complaints None  Eye Contact Fair  Facial Expression Animated  Affect Appropriate to circumstance  Speech Logical/coherent  Interaction Assertive  Motor Activity Slow  Appearance/Hygiene Unremarkable  Behavior Characteristics Cooperative;Appropriate to situation  Mood Pleasant  Aggressive Behavior  Effect No apparent injury  Thought Process  Coherency WDL  Content WDL  Delusions None reported or observed  Perception WDL  Hallucination None reported or observed  Judgment WDL  Confusion WDL  Danger to Self  Current suicidal ideation? Denies  Agreement Not to Harm Self Yes  Description of Agreement verbal  Danger to Others  Danger to Others None reported or observed    "

## 2024-02-22 NOTE — Progress Notes (Signed)
" °  [facility] Adult Case Management Discharge Plan :  Will you be returning to the same living situation after discharge:  No. At discharge, do you have transportation home?: Yes,  pt provided with taxi voucher.  Do you have the ability to pay for your medications: Yes,  [place] MEDICAID PREPAID HEALTH PLAN / [place] MEDICAID HEALTHY BLUE.  Release of information consent forms completed and in the chart;  Patient's signature needed at discharge.  Patient to Follow up at:  Follow-up Information     [facility] Follow up.   Specialty: Behavioral Health Why: Contact them to schedule follow up appointment. Contact information: [address] [place] [address] [phone]                 Next level of care provider has access to [facility] Link:no  Safety Planning and Suicide Prevention discussed: Yes,  SPE completed with mother, Ramonita Penny.      Has patient been referred to the Quitline?: Patient refused referral for treatment  Patient has been referred for addiction treatment: No known substance use disorder.  Nadara JONELLE Fam, LCSW 02/22/2024, 2:28 PM "

## 2024-02-22 NOTE — Group Note (Signed)
 Date:  02/22/2024 Time:  12:07 PM  Group Topic/Focus:  Goals Group:   The focus of this group is to help patients establish daily goals to achieve during treatment and discuss how the patient can incorporate goal setting into their daily lives to aide in recovery. We also went over positive thinking and redirecting negative thoughts to positive ones, while making a list of positive what if's to help positive redirection.   Participation Level:  Active  Participation Quality:  Appropriate  Affect:  Appropriate  Cognitive:  Appropriate  Insight: Appropriate  Engagement in Group:  Engaged  Modes of Intervention:  Activity  Additional Comments:    Leigh VEAR Pais 02/22/2024, 12:07 PM

## 2024-02-22 NOTE — BHH Counselor (Signed)
 CSW spoke with mother, Ramonita Penny [phone]) who confirmed that pt could go to their brother's home upon discharge. She confirmed address at [address] [place], [place], [place] [address]. Eaddy stated that pt would need transportation assistance to get there. CSW informed her that pt would be assisted with transportation to get there. She requested update regarding whether pt would be discharging today or tomorrow. No other concerns expressed. Contact ended without incident.   CSW did contact Eaddy and left message that pt would be discharging today.   Nadara SAUNDERS. Chaim, MSW, LCSW, LCAS 02/22/2024 12:56 PM

## 2024-02-22 NOTE — Progress Notes (Addendum)
 Patient denies SI/I/AVH at this time. Discharge instructions, AVS, prescriptions, and transition record reviewed with patient. Patient agrees to comply with medication management, follow-up visit and outpatient therapy. Patient belongings returned to patient. Patient questions and concerns addressed and answered. Patient ambulatory off unit. Patient discharged to home via taxi.

## 2024-02-22 NOTE — Plan of Care (Signed)
  Problem: Education: Goal: Knowledge of [facility] Education information/materials will improve Outcome: Progressing   Problem: Education: Goal: Emotional status will improve Outcome: Progressing   Problem: Education: Goal: Mental status will improve Outcome: Progressing   
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
## 2024-02-27 ENCOUNTER — Encounter: Payer: Self-pay | Admitting: Emergency Medicine

## 2024-02-27 ENCOUNTER — Ambulatory Visit: Admission: EM | Admit: 2024-02-27 | Discharge: 2024-02-27 | Disposition: A | Discharge status: Home / Self Care

## 2024-02-27 DIAGNOSIS — J101 Influenza due to other identified influenza virus with other respiratory manifestations: Secondary | ICD-10-CM

## 2024-02-27 LAB — POCT INFLUENZA A/B
Influenza A, POC: POSITIVE — AB
Influenza B, POC: NEGATIVE

## 2024-02-27 MED ORDER — OSELTAMIVIR PHOSPHATE 75 MG PO CAPS: 75.0000 mg | ORAL_CAPSULE | Freq: Two times a day (BID) | ORAL | 0 refills | Status: AC

## 2024-02-27 NOTE — ED Triage Notes (Addendum)
 Pt presents c/o Flu sxs x today. Pt states,  I have some cold sxs. I have cough, body aches, headaches. I have not had a fever yet. My throat hurts. Pt denies emesis ad diarrhea\.

## 2024-02-27 NOTE — ED Provider Notes (Signed)
 " [facility]    CSN: [id number] Arrival date & time: 02/27/24  1314      History   Chief Complaint Chief Complaint  Patient presents with   URI    HPI Wayne Henry is a 18 y.o. adult.   Patient presents today due to feeling poorly today.  Patient states that he woke up this morning with throat pain, body aches, and back pain.  Patient also has a cough that is wet sounding and produced black sputum this morning.  Patient denies known sick contacts or use of anything for symptoms.  Patient states that he has been drinking but not eating as much.  The history is provided by the patient.  URI   History reviewed. No pertinent past medical history.  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 No pertinent surgical history.     Home Medications    Prior to Admission medications  Medication Sig Start Date End Date Taking? Authorizing Provider  amoxicillin  (AMOXIL ) 500 MG tablet Take 500 mg by mouth 3 (three) times daily. 02/10/24  Yes [provider]  hydrOXYzine  (VISTARIL ) 25 MG capsule Take 25 mg by mouth 3 (three) times daily. 02/03/24  Yes [provider]  ibuprofen  (ADVIL ) 800 MG tablet Take 800 mg by mouth every 8 (eight) hours as needed. 02/17/24  Yes [provider]  oseltamivir  (TAMIFLU ) 75 MG capsule Take 1 capsule (75 mg total) by mouth every 12 (twelve) hours. 02/27/24  Yes Andra Corean BROCKS, PA-C  busPIRone  (BUSPAR ) 5 MG tablet Take 1 tablet (5 mg total) by mouth 2 (two) times daily. 02/22/24    Jadapalle, Sree, MD  hydrOXYzine  (ATARAX ) 25 MG tablet Take 1 tablet (25 mg total) by mouth 3 (three) times daily as needed for anxiety or nausea (sleep). 11/11/23   Cornelius Dines, MD  sertraline  (ZOLOFT ) 100 MG tablet Take 1 tablet (100 mg total) by mouth daily. 02/23/24   Jadapalle, Sree, MD  Vitamin D , Ergocalciferol , (DRISDOL ) 1.25 MG (50000 UNIT) CAPS capsule Take 1 capsule (50,000 Units total) by mouth every 7 (seven) days. Take 1 capsule on Mondays (or another day) 11/16/23   Cornelius Dines, MD    Family History Family History  Problem Relation Age of Onset   Diabetes Other    Hypertension Other    Heart Problems Maternal Aunt     Social History Social History[1]   Allergies   Patient has no known allergies.   Review of Systems Review of Systems   Physical Exam Triage Vital Signs ED Triage Vitals  Encounter Vitals Group     BP 02/27/24 1507 109/66     Girls Systolic BP Percentile --      Girls Diastolic BP Percentile --      Boys Systolic BP Percentile --      Boys Diastolic BP Percentile --      Pulse Rate 02/27/24 1507 (!) 103     Resp 02/27/24 1507 18     Temp 02/27/24 1507 (!) 100.5 F (38.1 C)     Temp Source 02/27/24 1507 Oral  SpO2 02/27/24 1507 98 %     Weight 02/27/24 1506 205 lb 14.6 oz (93.4 kg)     Height --      Head Circumference --      Peak Flow --      Pain Score 02/27/24 1506 10     Pain Loc --      Pain Education --      Exclude from Growth Chart --    No data found.  Updated Vital Signs BP 109/66 (BP Location: Left Arm)   Pulse (!) 103   Temp (!) 100.5 F (38.1 C) (Oral)   Resp 18   Wt 205 lb 14.6 oz (93.4 kg)   SpO2 98%   BMI 28.72 kg/m   Visual Acuity Right Eye Distance:   Left Eye Distance:   Bilateral Distance:    Right Eye Near:   Left Eye Near:    Bilateral Near:     Physical Exam Vitals and nursing note reviewed.  Constitutional:      General: She is not in acute distress.    Appearance: Normal  appearance. She is not ill-appearing, toxic-appearing or diaphoretic.  HENT:     Nose: Congestion (moderately enlarged turbinates) present. No rhinorrhea.     Mouth/Throat:     Mouth: Mucous membranes are moist.     Pharynx: Oropharynx is clear. No oropharyngeal exudate or posterior oropharyngeal erythema.  Eyes:     General: No scleral icterus. Cardiovascular:     Rate and Rhythm: Normal rate and regular rhythm.     Heart sounds: Normal heart sounds.  Pulmonary:     Effort: Pulmonary effort is normal. No respiratory distress.     Breath sounds: Normal breath sounds. No wheezing or rhonchi.  Skin:    General: Skin is warm.  Neurological:     Mental Status: She is alert and oriented to person, place, and time.  Psychiatric:        Mood and Affect: Mood normal.        Behavior: Behavior normal.      UC Treatments / Results  Labs (all labs ordered are listed, but only abnormal results are displayed) Labs Reviewed  POCT INFLUENZA A/B - Abnormal; Notable for the following components:      Result Value   Influenza A, POC Positive (*)    All other components within normal limits    EKG   Radiology No results found.  Procedures Procedures (including critical care time)  Medications Ordered in UC Medications - No data to display  Initial Impression / Assessment and Plan / UC Course  I have reviewed the triage vital signs and the nursing notes.  Pertinent labs & imaging results that were available during my care of the patient were reviewed by me and considered in my medical decision making (see chart for details).     Final Clinical Impressions(s) / UC Diagnoses   Final diagnoses:  Influenza A     Discharge Instructions      You been diagnosed with a viral illness today. -Viruses have to run their course and medicines that are prescribed are meant to help with symptoms. - With viruses usually feel poorly from 3 to 7 days with cough being the last symptoms to  resolve.  -Cough can linger from days to weeks.  Antibiotics are not effective for viruses. -If your cough lasts more than 2 weeks and you are coughing so hard that you are vomiting or feel like you could  pass out we need to follow-up with PCP for further testing and evaluation. -Rest, increase water intake, may use pseudoephedrine for nasal congestion, Delsym (dextromethorphan) or honey as needed for cough, and ibuprofen  and/or Tylenol  as directed on packaging for pain and fever. -If you have hypertension you should take Coricidin or other OTC meds approved for people with high blood pressure. -You may use a spoonful of honey every 4-6 hours as needed for throat pain and cough. -Warm tea with honey and lemon are helpful for soothe throat as well.  Chloraseptic and Cepacol make a throat lozenge with numbing medication, can be purchased over-the-counter. -May also use Flonase  or sinus rinse for sinus pressure or nasal congestion.  Be sure to use distilled bottled water for sinus rinses. -May use coolmist humidifier to open up nasal passages -May elevate head to assist with postnasal drainage. -If you feel poorly (fever, fatigue, shortness of breath, nausea, etc.) for more than 10 days to be sure to follow-up with PCP or in clinic for further evaluation and additional treatments. If you experience chest pain with shortness of breath or pulse oxygen less than 95% you should report to the ER.    ED Prescriptions     Medication Sig Dispense Auth. Provider   oseltamivir  (TAMIFLU ) 75 MG capsule Take 1 capsule (75 mg total) by mouth every 12 (twelve) hours. 10 capsule Andra Corean BROCKS, PA-C      PDMP not reviewed this encounter.    [1]  Social History Tobacco Use   Smoking status: Never    Passive exposure: Never   Smokeless tobacco: Never  Vaping Use   Vaping status: Never Used  Substance Use Topics   Alcohol use: Never   Drug use: Never     Andra Corean BROCKS, PA-C 02/27/24  1533  "

## 2024-02-27 NOTE — Discharge Instructions (Signed)

## 2024-03-07 ENCOUNTER — Encounter: Payer: Self-pay | Admitting: Family Medicine
